# Patient Record
Sex: Male | Born: 1980 | Race: Black or African American | Hispanic: No | Marital: Married | Smoking: Former smoker
Health system: Southern US, Community
[De-identification: ages and names within clinical notes are randomized; demographics above are authoritative.]

## PROBLEM LIST (undated history)

## (undated) DIAGNOSIS — F419 Anxiety disorder, unspecified: Secondary | ICD-10-CM

## (undated) DIAGNOSIS — F4310 Post-traumatic stress disorder, unspecified: Secondary | ICD-10-CM

---

## 2011-09-29 ENCOUNTER — Encounter (HOSPITAL_COMMUNITY): Payer: Self-pay | Admitting: Emergency Medicine

## 2011-09-29 ENCOUNTER — Observation Stay (HOSPITAL_COMMUNITY): Payer: BC Managed Care – PPO

## 2011-09-29 ENCOUNTER — Emergency Department (HOSPITAL_COMMUNITY): Payer: BC Managed Care – PPO

## 2011-09-29 ENCOUNTER — Observation Stay (HOSPITAL_COMMUNITY)
Admission: EM | Admit: 2011-09-29 | Discharge: 2011-09-29 | Disposition: A | Discharge status: Home / Self Care | Payer: BC Managed Care – PPO | Attending: Emergency Medicine | Admitting: Emergency Medicine

## 2011-09-29 DIAGNOSIS — Z79899 Other long term (current) drug therapy: Secondary | ICD-10-CM | POA: Insufficient documentation

## 2011-09-29 DIAGNOSIS — F411 Generalized anxiety disorder: Secondary | ICD-10-CM | POA: Insufficient documentation

## 2011-09-29 DIAGNOSIS — F4310 Post-traumatic stress disorder, unspecified: Secondary | ICD-10-CM | POA: Insufficient documentation

## 2011-09-29 DIAGNOSIS — R079 Chest pain, unspecified: Principal | ICD-10-CM | POA: Insufficient documentation

## 2011-09-29 HISTORY — DX: Post-traumatic stress disorder, unspecified: F43.10

## 2011-09-29 HISTORY — DX: Anxiety disorder, unspecified: F41.9

## 2011-09-29 LAB — CBC
MCH: 30 pg (ref 26.0–34.0)
MCHC: 34.1 g/dL (ref 30.0–36.0)
MCV: 88 fL (ref 78.0–100.0)
Platelets: 247 10*3/uL (ref 150–400)
RDW: 12.2 % (ref 11.5–15.5)

## 2011-09-29 LAB — BASIC METABOLIC PANEL
Calcium: 9.7 mg/dL (ref 8.4–10.5)
Creatinine, Ser: 1.08 mg/dL (ref 0.50–1.35)
GFR calc Af Amer: >90 mL/min (ref >90)
GFR calc non Af Amer: >90 mL/min (ref >90)
Sodium: 137 mEq/L (ref 135–145)

## 2011-09-29 LAB — TROPONIN I: Troponin I: <0.3 ng/mL (ref <0.30)

## 2011-09-29 MED ORDER — METOPROLOL TARTRATE 1 MG/ML IV SOLN
INTRAVENOUS | Status: AC
  Filled 2011-09-29: qty 10

## 2011-09-29 MED ORDER — MORPHINE SULFATE 4 MG/ML IJ SOLN: 4.0000 mg | Freq: Once | INTRAMUSCULAR | Status: DC

## 2011-09-29 MED ORDER — METOPROLOL TARTRATE 25 MG PO TABS
100.0000 mg | ORAL_TABLET | Freq: Once | ORAL | Status: AC
  Administered 2011-09-29: 100 mg via ORAL
  Filled 2011-09-29: qty 4

## 2011-09-29 MED ORDER — NITROGLYCERIN 0.4 MG SL SUBL
0.4000 mg | SUBLINGUAL_TABLET | SUBLINGUAL | Status: DC | PRN
  Filled 2011-09-29: qty 25

## 2011-09-29 MED ORDER — IOHEXOL 350 MG/ML SOLN
80.0000 mL | Freq: Once | INTRAVENOUS | Status: AC | PRN
  Administered 2011-09-29: 80 mL via INTRAVENOUS

## 2011-09-29 MED ORDER — ONDANSETRON HCL 4 MG/2ML IJ SOLN: 4.0000 mg | Freq: Once | INTRAMUSCULAR | Status: DC

## 2011-09-29 NOTE — ED Notes (Signed)
Pt with substernal chest pain, non-radiating, denies nausea, vomiting, SOB or diaphoresis. Hx of PTSD and anxiety. Pt initially rating pain 5/10. Total relief for EMS with 1 SL nitro. 20g RAC. 324mg  ASA PTA.

## 2011-09-29 NOTE — ED Notes (Signed)
Patient education provided on coronary ct

## 2011-09-29 NOTE — ED Notes (Signed)
His bmi is 35.7, ct called and made aware, they will let dr Kearney Hard know

## 2011-09-29 NOTE — ED Provider Notes (Signed)
ECG shows normal sinus rhythm with a rate of 80, no ectopy. Normal axis. Normal P wave. Normal QRS. Normal intervals. Normal ST and T waves. Impression: normal ECG. No old ECG available for comparison.   Dione Booze, MD 09/29/11 1016

## 2011-09-29 NOTE — ED Notes (Signed)
Return from CT.   While in CT  16:40 heart rate 70  Pt was given Metoprolol 5mg  IV.  16:45 heart rate 67 Pt was given Metoprolol 5mg . IV   16:48 heart rate 68 Pt was given NTG 1 SL.  Pt tolerated procedure well.

## 2011-09-29 NOTE — ED Notes (Signed)
Pt continues to be pain free. Intermittently sleeping. Pt aware of plan to recheck labs and d/c home if normal.

## 2011-09-29 NOTE — Discharge Instructions (Signed)
Read the information below.  The CT scan of your heart shows that you currently have no coronary artery disease.  Please follow up with your primary care provider.  You may return to the ER at any time for worsening condition or any new symptoms that concern you.

## 2011-09-29 NOTE — ED Provider Notes (Signed)
Medical screening examination/treatment/procedure(s) were performed by non-physician practitioner and as supervising physician I was immediately available for consultation/collaboration.   Dione Booze, MD 09/29/11 [phone]

## 2011-09-29 NOTE — ED Provider Notes (Signed)
3:49 PM Patient is in CDU under observation, chest pain protocol.  Pt's only medical hx is PTSD and anxiety, but has strong family hx of CAD.  No PCP - pt is in the [place] system but has not become established here.  Pt reports he continues to have intermittent chest pain at rest, occuring every 30-40 minutes, lasting 10 minutes at a time.  Pain is sharp and over the left chest.  No associated symptoms.  Plan is for coronary CT.  BMI is slightly higher than recommended, nurse has alerted the radiologist for permission to proceed.  On exam, pt is A&Ox4, NAD, RRR, no m/r/g, CTAB, abd soft, NT, extremities without edema, distal pulses intact and equal bilaterally.  No needs at this time.  Will continue to follow.    5:11 PM I received a call from Dr Kearney Hard.  Coronary CT is negative for CAD, no additional findings.    5:41 PM Discussed results with patient.  Pt states he is actually established already with the [place] in [place], will follow up with them.  Plan is for d/c home , PCP follow up.  Patient verbalizes understanding and agrees with plan.    Brandon [name] [place], [place] 09/29/11 [phone]

## 2011-09-29 NOTE — ED Provider Notes (Signed)
History     CSN: [id number]  Arrival date & time 09/29/11  1006   First MD Initiated Contact with Patient 09/29/11 1009      Chief Complaint  Patient presents with  . Chest Pain    (Consider location/radiation/quality/duration/timing/severity/associated sxs/prior treatment) HPI  Patient to ED from work by EMS for chest pains. They started acutely at 9am and were sharp, constant and substernal. The pains were 6/10 and relieved with 324 asa and 1 SL nitro given by EMS prior to arrival. Prior to arrival patients pain completely alleviated. He says this happened once before about 10 years ago and he was told it was stress and anxiety related. He has a history of PTSD and takes a "medication that starts with an 'M'" for it.  During my interview he informs me that his chest pain is coming back and is a 5/10 now. Denies recent illness, cough, SOB, and feeling anxious  Patient informs me he has a significant family history a cousin died at 4 recently, an uncle and a few other cousins have heart disease. He says that he does not know about his mom, dad, brothers or sisters. They are all still alive and they have never had chest pains before, according to patient.  Past Medical History  Diagnosis Date  . Anxiety   . PTSD (post-traumatic stress disorder)     History reviewed. No pertinent past surgical history.  History reviewed. No pertinent family history.  History  Substance Use Topics  . Smoking status: Former Games developer  . Smokeless tobacco: Not on file  . Alcohol Use: Yes     socially weekends      Review of Systems   HEENT: denies blurry vision or change in hearing PULMONARY: Denies difficulty breathing and SOB CARDIAC: denies heart palpitations MUSCULOSKELETAL:  denies being unable to ambulate ABDOMEN AL: denies abdominal pain GU: denies loss of bowel or urinary control NEURO: denies numbness and tingling in extremities SKIN: no new rashes PSYCH: patient behavior is  normal NECK: Not complaining of neck pain     Allergies  Review of patient's allergies indicates not on file.  Home Medications   Current Outpatient Rx  Name Route Sig Dispense Refill  . NON FORMULARY Oral Take 1 tablet by mouth daily.      BP 115/64  Pulse 70  Temp(Src) 98 F (36.7 C) (Oral)  Resp 18  SpO2 97%  Physical Exam  Nursing note and vitals reviewed. Constitutional: He appears well-developed and well-nourished. No distress.  HENT:  Head: Normocephalic and atraumatic.  Eyes: Pupils are equal, round, and reactive to light.  Neck: Normal range of motion. Neck supple.  Cardiovascular: Normal rate and regular rhythm.   Pulmonary/Chest: Effort normal.  Abdominal: Soft.  Neurological: He is alert.  Skin: Skin is warm and dry.    ED Course  Procedures (including critical care time)  Labs Reviewed  CBC - Abnormal; Notable for the following:    RBC 4.10 (*)    Hemoglobin 12.3 (*)    HCT 36.1 (*)    All other components within normal limits  BASIC METABOLIC PANEL  TROPONIN I  TROPONIN I   Dg Chest 2 View  09/29/2011  *RADIOLOGY REPORT*  Clinical Data: Left-sided chest pain.  CHEST - 2 VIEW  Comparison: None.  Findings:  Cardiopericardial silhouette within normal limits. Mediastinal contours normal. Trachea midline.  No airspace disease or effusion.  IMPRESSION: Negative two-view chest.  Original Report Authenticated By: Andreas Newport, M.D.  1. Chest pain       MDM   Date: 09/29/2011  Rate: 80  Rhythm: normal sinus rhythm  QRS Axis: normal  Intervals: normal  ST/T Wave abnormalities: normal  Conduction Disutrbances:none  Narrative Interpretation:   Old EKG Reviewed: none available and unchanged   Patient is currently CP free, no cardiac history, negative EKG, negative Troponin.  I have discussed with Dr. Preston Fleeting and we feel that CPP with CT angio of heart is recommended for a rule out.  I have discussed patient with Melvenia Beam, PA-C. Who has  agreed to accept patient into the CDU.       Dorthula Matas, PA 09/29/11 1353

## 2011-09-30 NOTE — ED Provider Notes (Signed)
Medical screening examination/treatment/procedure(s) were performed by non-physician practitioner and as supervising physician I was immediately available for consultation/collaboration.   Dione Booze, MD 09/30/11 [phone]

## 2012-05-12 ENCOUNTER — Ambulatory Visit: Payer: BC Managed Care – PPO

## 2012-05-12 ENCOUNTER — Ambulatory Visit (INDEPENDENT_AMBULATORY_CARE_PROVIDER_SITE_OTHER): Payer: BC Managed Care – PPO | Admitting: Family Medicine

## 2012-05-12 VITALS — BP 135/81 | HR 96 | Temp 98.8°F | Resp 18 | Ht 68.5 in | Wt 242.0 lb

## 2012-05-12 DIAGNOSIS — R042 Hemoptysis: Secondary | ICD-10-CM

## 2012-05-12 DIAGNOSIS — K219 Gastro-esophageal reflux disease without esophagitis: Secondary | ICD-10-CM

## 2012-05-12 MED ORDER — HYDROCODONE-HOMATROPINE 5-1.5 MG/5ML PO SYRP: 5.0000 mL | ORAL_SOLUTION | Freq: Three times a day (TID) | ORAL | Status: DC | PRN

## 2012-05-12 MED ORDER — OMEPRAZOLE 20 MG PO CPDR: 20.0000 mg | DELAYED_RELEASE_CAPSULE | Freq: Every day | ORAL | Status: AC

## 2012-05-12 MED ORDER — AZITHROMYCIN 250 MG PO TABS: ORAL_TABLET | ORAL | Status: DC

## 2012-05-12 NOTE — Progress Notes (Signed)
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  Also has one week of reflux symptoms with heartburn and vomited last night.  Nonsmoker, no h/o asthma  Rx:  Alka-seltzer, advil cold and sinus, robitussin  Objective:  NAD, alert, cooperative Ears:  Some wax right ear Oroph:  Clear, mild erythema Heart:  Regular, no murmur Chest:  ronchi bilaterally  [facility] reading (PRIMARY) by  Dr. Milus Glazier:  CXR heavy markings  1. Hemoptysis  DG Chest 2 View, HYDROcodone-homatropine (HYCODAN) 5-1.5 MG/5ML syrup, azithromycin (ZITHROMAX Z-PAK) 250 MG tablet  2. GERD (gastroesophageal reflux disease)  omeprazole (PRILOSEC) 20 MG capsule   .

## 2013-05-18 ENCOUNTER — Ambulatory Visit: Payer: BC Managed Care – PPO

## 2013-05-18 ENCOUNTER — Ambulatory Visit (INDEPENDENT_AMBULATORY_CARE_PROVIDER_SITE_OTHER): Payer: BC Managed Care – PPO | Admitting: Emergency Medicine

## 2013-05-18 VITALS — BP 112/70 | HR 69 | Temp 98.0°F | Resp 16 | Ht 68.0 in | Wt 220.0 lb

## 2013-05-18 DIAGNOSIS — M25579 Pain in unspecified ankle and joints of unspecified foot: Secondary | ICD-10-CM

## 2013-05-18 DIAGNOSIS — S93409A Sprain of unspecified ligament of unspecified ankle, initial encounter: Secondary | ICD-10-CM

## 2013-05-18 MED ORDER — ACETAMINOPHEN-CODEINE #3 300-30 MG PO TABS: 1.0000 | ORAL_TABLET | ORAL | Status: DC | PRN

## 2013-05-18 MED ORDER — NAPROXEN SODIUM 550 MG PO TABS: 550.0000 mg | ORAL_TABLET | Freq: Two times a day (BID) | ORAL | Status: AC

## 2013-05-18 NOTE — Progress Notes (Addendum)
[facility] [address], [place] [address] [phone]  Date:  05/18/2013   Name:  Brandon [name]   DOB:  Jun 04, 1980   MRN:  [id number]  PCP:  No primary provider on file.    Chief Complaint: Foot Pain   History of Present Illness:  Brandon CarinaDeon Fieldhouse is a 33 y.o. very pleasant male patient who presents with the following:  Injured today when he stepped on another player's foot while playing basketball.  Suffered an inversion injury.  Not able to comfortably bear weight.  No improvement with over the counter medications or other home remedies. Denies other complaint or health concern today.  There are no active problems to display for this patient.   Past Medical History  Diagnosis Date  . Anxiety   . PTSD (post-traumatic stress disorder)     History reviewed. No pertinent past surgical history.  History  Substance Use Topics  . Smoking status: Former Games developermoker  . Smokeless tobacco: Not on file  . Alcohol Use: No     Comment: socially weekends    Family History  Problem Relation Age of Onset  . Diabetes Mother   . Hypertension Mother   . Heart disease Maternal Aunt   . Heart disease Maternal Uncle   . Heart disease Cousin     No Known Allergies  Medication list has been reviewed and updated.  Current Outpatient Prescriptions on File Prior to Visit  Medication Sig Dispense Refill  . azithromycin (ZITHROMAX Z-PAK) 250 MG tablet Take as directed on pack  6 tablet  0  . HYDROcodone-homatropine (HYCODAN) 5-1.5 MG/5ML syrup Take 5 mLs by mouth every 8 (eight) hours as needed for cough.  120 mL  0  . NON FORMULARY Take 1 tablet by mouth daily.      Marland Kitchen. omeprazole (PRILOSEC) 20 MG capsule Take 1 capsule (20 mg total) by mouth daily.  20 capsule  3   No current facility-administered medications on file prior to visit.    Review of Systems:  As per HPI, otherwise negative.    Physical Examination: Filed Vitals:   05/18/13 1601  BP: 112/70   Pulse: 69  Temp: 98 F (36.7 C)  Resp: 16   Filed Vitals:   05/18/13 1601  Height: 5\' 8"  (1.727 m)  Weight: 220 lb (99.791 kg)   Body mass index is 33.46 kg/(m^2). Ideal Body Weight: Weight in (lb) to have BMI = 25: 164.1   GEN: WDWN, NAD, Non-toxic, Alert & Oriented x 3 HEENT: Atraumatic, Normocephalic.  Ears and Nose: No external deformity. EXTR: No clubbing/cyanosis/edema NEURO: Normal gait.  PSYCH: Normally interactive. Conversant. Not depressed or anxious appearing.  Calm demeanor.  LEFT ankle:  Tender lateral foot in fifth MT region  Assessment and Plan: Sprained left ankle Boot Anaprox Tylenol #3    Signed,  Phillips OdorJeffery Efe Fazzino, MD  [facility] reading (PRIMARY) by  Dr. Dareen PianoAnderson.  Negative ankle. [facility] reading (PRIMARY) by  Dr. Dareen PianoAnderson. Negative foot.

## 2013-05-18 NOTE — Patient Instructions (Signed)

## 2013-05-26 ENCOUNTER — Ambulatory Visit: Payer: BC Managed Care – PPO

## 2013-05-26 ENCOUNTER — Ambulatory Visit (INDEPENDENT_AMBULATORY_CARE_PROVIDER_SITE_OTHER): Payer: BC Managed Care – PPO | Admitting: Family Medicine

## 2013-05-26 VITALS — BP 110/72 | HR 68 | Temp 98.4°F | Resp 16

## 2013-05-26 DIAGNOSIS — S93609A Unspecified sprain of unspecified foot, initial encounter: Secondary | ICD-10-CM

## 2013-05-26 DIAGNOSIS — S93602A Unspecified sprain of left foot, initial encounter: Secondary | ICD-10-CM
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
## 2013-05-26 NOTE — Patient Instructions (Signed)
1. Wear supportive shoe at all times; avoid walking barefooted. 2. Return if pain persists or worsens. 3.  You can return to regular duty at work.

## 2013-06-12 ENCOUNTER — Ambulatory Visit (INDEPENDENT_AMBULATORY_CARE_PROVIDER_SITE_OTHER): Payer: BC Managed Care – PPO | Admitting: Podiatry

## 2013-06-12 ENCOUNTER — Encounter: Payer: Self-pay | Admitting: Podiatry

## 2013-06-12 ENCOUNTER — Ambulatory Visit (INDEPENDENT_AMBULATORY_CARE_PROVIDER_SITE_OTHER): Payer: BC Managed Care – PPO

## 2013-06-12 VITALS — BP 117/74 | HR 85 | Resp 16 | Ht 68.0 in | Wt 210.0 lb

## 2013-06-12 DIAGNOSIS — M79609 Pain in unspecified limb: Secondary | ICD-10-CM

## 2013-06-12 DIAGNOSIS — M79673 Pain in unspecified foot: Secondary | ICD-10-CM

## 2013-06-12 DIAGNOSIS — S93409A Sprain of unspecified ligament of unspecified ankle, initial encounter: Secondary | ICD-10-CM

## 2013-06-12 DIAGNOSIS — M7750 Other enthesopathy of unspecified foot: Secondary | ICD-10-CM

## 2013-06-12 MED ORDER — TRIAMCINOLONE ACETONIDE 10 MG/ML IJ SUSP
10.0000 mg | Freq: Once | INTRAMUSCULAR | Status: AC
  Administered 2013-06-12: 10 mg

## 2013-06-12 NOTE — Progress Notes (Signed)
   Subjective:    Patient ID: Brandon [name], male    DOB: 03/27/1981, 33 y.o.   MRN: [id number]  HPI Comments: "I hurt my foot playing basketball"  Patient C/O painful lateral left foot for 3 weeks now. States that he injured his foot while playing basketball. Says that he stepped on someone's foot and his foot rolled over. Since it's been swollen and now has a knot. He has been using ice and elevating. He did present to Urgent Care (notes are in chart review) early Feb. Says that they xrayed, said no fracture, put in boot, wore for 1 week and Rx 'd him pain medication. It does feel some better.  Foot Pain      Review of Systems  Musculoskeletal: Positive for gait problem.       Objective:   Physical Exam        Assessment & Plan:

## 2013-06-12 NOTE — Progress Notes (Signed)
Subjective:     Patient ID: Brandon [name], male   DOB: Aug 25, 1980, 33 y.o.   MRN: [id number]  Foot Pain   patient presents stating I turned my left ankle and it has still been bothering me when I try to be active. Stated it occurred 3 weeks ago and he would like to see his or anything that can be done to help the condition   Review of Systems  All other systems reviewed and are negative.       Objective:   Physical Exam  Constitutional: He is oriented to person, place, and time.  Cardiovascular: Intact distal pulses.   Musculoskeletal: Normal range of motion.  Neurological: He is oriented to person, place, and time.  Skin: Skin is warm.   neurovascular status intact with muscle strength adequate and no indications of range of motion loss except some splinting in the left ankle due to pain. The pain is around the fifth metatarsal base with mild inflammation and bruising noted with no indications of tendon compromise     Assessment:     Probable tendinitis with possible fracture left fifth metatarsal base secondary to injury 3 weeks ago    Plan:     H&P and x-rays reviewed. Today careful injection was administered to this area with instructions on reduced activity ice therapy and stretching exercises. Hopefully this will reduce the symptoms for him but it's possible that this still may take another 8-12 weeks to heal. Reappoint if symptoms persist

## 2014-12-22 ENCOUNTER — Emergency Department (HOSPITAL_COMMUNITY): Payer: BLUE CROSS/BLUE SHIELD | Admitting: Certified Registered Nurse Anesthetist

## 2014-12-22 ENCOUNTER — Encounter (HOSPITAL_COMMUNITY)
Admission: EM | Disposition: A | Discharge status: Home Health | Payer: Self-pay | Source: Home / Self Care | Attending: Orthopedic Surgery

## 2014-12-22 ENCOUNTER — Emergency Department (HOSPITAL_COMMUNITY): Payer: BLUE CROSS/BLUE SHIELD

## 2014-12-22 ENCOUNTER — Inpatient Hospital Stay (HOSPITAL_COMMUNITY)
Admission: EM | Admit: 2014-12-22 | Discharge: 2014-12-24 | DRG: 494 | Disposition: A | Discharge status: Home Health | Payer: BLUE CROSS/BLUE SHIELD | Attending: Orthopedic Surgery | Admitting: Orthopedic Surgery

## 2014-12-22 ENCOUNTER — Encounter (HOSPITAL_COMMUNITY): Payer: Self-pay | Admitting: Emergency Medicine

## 2014-12-22 DIAGNOSIS — M79661 Pain in right lower leg: Secondary | ICD-10-CM | POA: Diagnosis not present

## 2014-12-22 DIAGNOSIS — T1490XA Injury, unspecified, initial encounter: Secondary | ICD-10-CM

## 2014-12-22 DIAGNOSIS — S82201C Unspecified fracture of shaft of right tibia, initial encounter for open fracture type IIIA, IIIB, or IIIC: Secondary | ICD-10-CM

## 2014-12-22 DIAGNOSIS — S82201B Unspecified fracture of shaft of right tibia, initial encounter for open fracture type I or II: Principal | ICD-10-CM | POA: Diagnosis present

## 2014-12-22 DIAGNOSIS — S82209A Unspecified fracture of shaft of unspecified tibia, initial encounter for closed fracture: Secondary | ICD-10-CM

## 2014-12-22 DIAGNOSIS — Z87891 Personal history of nicotine dependence: Secondary | ICD-10-CM

## 2014-12-22 DIAGNOSIS — S82209B Unspecified fracture of shaft of unspecified tibia, initial encounter for open fracture type I or II: Secondary | ICD-10-CM | POA: Diagnosis present

## 2014-12-22 DIAGNOSIS — S82201A Unspecified fracture of shaft of right tibia, initial encounter for closed fracture: Secondary | ICD-10-CM | POA: Diagnosis present

## 2014-12-22 DIAGNOSIS — S82401C Unspecified fracture of shaft of right fibula, initial encounter for open fracture type IIIA, IIIB, or IIIC: Secondary | ICD-10-CM

## 2014-12-22 DIAGNOSIS — Z79899 Other long term (current) drug therapy: Secondary | ICD-10-CM

## 2014-12-22 DIAGNOSIS — Z419 Encounter for procedure for purposes other than remedying health state, unspecified: Secondary | ICD-10-CM

## 2014-12-22 DIAGNOSIS — Y92524 Gas station as the place of occurrence of the external cause: Secondary | ICD-10-CM

## 2014-12-22 HISTORY — PX: TIBIA IM NAIL INSERTION: SHX2516

## 2014-12-22 HISTORY — PX: I & D EXTREMITY: SHX5045

## 2014-12-22 HISTORY — PX: ORIF FIBULA FRACTURE: SHX5114

## 2014-12-22 LAB — ETHANOL

## 2014-12-22 LAB — PROTIME-INR
INR: 1.04 (ref 0.00–1.49)
PROTHROMBIN TIME: 13.8 s (ref 11.6–15.2)

## 2014-12-22 LAB — COMPREHENSIVE METABOLIC PANEL
ALBUMIN: 4.3 g/dL (ref 3.5–5.0)
ALK PHOS: 57 U/L (ref 38–126)
ALT: 14 U/L — ABNORMAL LOW (ref 17–63)
ANION GAP: 8 (ref 5–15)
AST: 30 U/L (ref 15–41)
BILIRUBIN TOTAL: 0.8 mg/dL (ref 0.3–1.2)
BUN: 9 mg/dL (ref 6–20)
CALCIUM: 9 mg/dL (ref 8.9–10.3)
CO2: 27 mmol/L (ref 22–32)
Chloride: 101 mmol/L (ref 101–111)
Creatinine, Ser: 1.23 mg/dL (ref 0.61–1.24)
GFR calc non Af Amer: >60 mL/min (ref >60)
GLUCOSE: 106 mg/dL — AB (ref 65–99)
Potassium: 3.9 mmol/L (ref 3.5–5.1)
Sodium: 136 mmol/L (ref 135–145)
TOTAL PROTEIN: 7.7 g/dL (ref 6.5–8.1)

## 2014-12-22 LAB — CBC
HCT: 36.3 % — ABNORMAL LOW (ref 39.0–52.0)
HEMOGLOBIN: 12.2 g/dL — AB (ref 13.0–17.0)
MCH: 29.9 pg (ref 26.0–34.0)
MCHC: 33.6 g/dL (ref 30.0–36.0)
MCV: 89 fL (ref 78.0–100.0)
PLATELETS: 232 10*3/uL (ref 150–400)
RBC: 4.08 MIL/uL — ABNORMAL LOW (ref 4.22–5.81)
RDW: 12 % (ref 11.5–15.5)
WBC: 9.1 10*3/uL (ref 4.0–10.5)

## 2014-12-22 LAB — SAMPLE TO BLOOD BANK

## 2014-12-22 SURGERY — INSERTION, INTRAMEDULLARY ROD, TIBIA
Anesthesia: General | Site: Leg Lower | Laterality: Right

## 2014-12-22 MED ORDER — MIDAZOLAM HCL 5 MG/5ML IJ SOLN
INTRAMUSCULAR | Status: DC | PRN
  Administered 2014-12-22: 2 mg via INTRAVENOUS

## 2014-12-22 MED ORDER — LIDOCAINE HCL (CARDIAC) 20 MG/ML IV SOLN
INTRAVENOUS | Status: DC | PRN
  Administered 2014-12-22: 50 mg via INTRAVENOUS

## 2014-12-22 MED ORDER — SUCCINYLCHOLINE CHLORIDE 20 MG/ML IJ SOLN
INTRAMUSCULAR | Status: DC | PRN
  Administered 2014-12-22: 120 mg via INTRAVENOUS

## 2014-12-22 MED ORDER — TETANUS-DIPHTHERIA TOXOIDS TD 5-2 LFU IM INJ
0.5000 mL | INJECTION | Freq: Once | INTRAMUSCULAR | Status: AC
  Administered 2014-12-22: 0.5 mL via INTRAMUSCULAR
  Filled 2014-12-22: qty 0.5

## 2014-12-22 MED ORDER — CEFAZOLIN SODIUM-DEXTROSE 2-3 GM-% IV SOLR
INTRAVENOUS | Status: DC | PRN
  Administered 2014-12-22: 2 g via INTRAVENOUS

## 2014-12-22 MED ORDER — ROCURONIUM BROMIDE 100 MG/10ML IV SOLN
INTRAVENOUS | Status: DC | PRN
  Administered 2014-12-22: 10 mg via INTRAVENOUS
  Administered 2014-12-22: 50 mg via INTRAVENOUS
  Administered 2014-12-22: 20 mg via INTRAVENOUS
  Administered 2014-12-22: 10 mg via INTRAVENOUS

## 2014-12-22 MED ORDER — MIDAZOLAM HCL 2 MG/2ML IJ SOLN
INTRAMUSCULAR | Status: AC
  Filled 2014-12-22: qty 4

## 2014-12-22 MED ORDER — PROPOFOL 10 MG/ML IV BOLUS
INTRAVENOUS | Status: AC
  Filled 2014-12-22: qty 20

## 2014-12-22 MED ORDER — DEXAMETHASONE SODIUM PHOSPHATE 4 MG/ML IJ SOLN
INTRAMUSCULAR | Status: AC
  Filled 2014-12-22: qty 2

## 2014-12-22 MED ORDER — DEXAMETHASONE SODIUM PHOSPHATE 4 MG/ML IJ SOLN
INTRAMUSCULAR | Status: DC | PRN
  Administered 2014-12-22: 8 mg via INTRAVENOUS

## 2014-12-22 MED ORDER — HYDROMORPHONE HCL 1 MG/ML IJ SOLN
1.0000 mg | Freq: Once | INTRAMUSCULAR | Status: AC
  Administered 2014-12-22: 1 mg via INTRAVENOUS
  Filled 2014-12-22: qty 1

## 2014-12-22 MED ORDER — SODIUM CHLORIDE 0.9 % IV BOLUS (SEPSIS)
500.0000 mL | Freq: Once | INTRAVENOUS | Status: AC
  Administered 2014-12-22: 500 mL via INTRAVENOUS

## 2014-12-22 MED ORDER — ONDANSETRON HCL 4 MG/2ML IJ SOLN
INTRAMUSCULAR | Status: AC
  Filled 2014-12-22: qty 6

## 2014-12-22 MED ORDER — 0.9 % SODIUM CHLORIDE (POUR BTL) OPTIME
TOPICAL | Status: DC | PRN
  Administered 2014-12-22: 1000 mL

## 2014-12-22 MED ORDER — SODIUM CHLORIDE 0.9 % IR SOLN
Status: DC | PRN
  Administered 2014-12-22 (×2): 3000 mL

## 2014-12-22 MED ORDER — NEOSTIGMINE METHYLSULFATE 10 MG/10ML IV SOLN
INTRAVENOUS | Status: AC
  Filled 2014-12-22: qty 2

## 2014-12-22 MED ORDER — PROPOFOL 10 MG/ML IV BOLUS
INTRAVENOUS | Status: DC | PRN
  Administered 2014-12-22: 300 mg via INTRAVENOUS

## 2014-12-22 MED ORDER — ONDANSETRON HCL 4 MG/2ML IJ SOLN
4.0000 mg | Freq: Once | INTRAMUSCULAR | Status: AC
  Administered 2014-12-22: 4 mg via INTRAVENOUS
  Filled 2014-12-22: qty 2

## 2014-12-22 MED ORDER — SODIUM CHLORIDE 0.9 % IV BOLUS (SEPSIS)
1000.0000 mL | Freq: Once | INTRAVENOUS | Status: AC
  Administered 2014-12-22: 1000 mL via INTRAVENOUS

## 2014-12-22 MED ORDER — LACTATED RINGERS IV SOLN
INTRAVENOUS | Status: DC | PRN
  Administered 2014-12-22 – 2014-12-23 (×3): via INTRAVENOUS

## 2014-12-22 MED ORDER — ARTIFICIAL TEARS OP OINT
TOPICAL_OINTMENT | OPHTHALMIC | Status: AC
  Filled 2014-12-22: qty 3.5

## 2014-12-22 MED ORDER — CEFAZOLIN SODIUM 1-5 GM-% IV SOLN
1.0000 g | Freq: Once | INTRAVENOUS | Status: AC
  Administered 2014-12-22: 1 g via INTRAVENOUS
  Filled 2014-12-22: qty 50

## 2014-12-22 MED ORDER — ARTIFICIAL TEARS OP OINT
TOPICAL_OINTMENT | OPHTHALMIC | Status: DC | PRN
  Administered 2014-12-22: 1 via OPHTHALMIC

## 2014-12-22 MED ORDER — FENTANYL CITRATE (PF) 250 MCG/5ML IJ SOLN
INTRAMUSCULAR | Status: AC
Start: 2014-12-22 — End: 2014-12-22
  Filled 2014-12-22: qty 5

## 2014-12-22 MED ORDER — FENTANYL CITRATE (PF) 250 MCG/5ML IJ SOLN
INTRAMUSCULAR | Status: AC
  Filled 2014-12-22: qty 5

## 2014-12-22 MED ORDER — FENTANYL CITRATE (PF) 100 MCG/2ML IJ SOLN
INTRAMUSCULAR | Status: DC | PRN
Start: 2014-12-22 — End: 2014-12-23
  Administered 2014-12-22: 100 ug via INTRAVENOUS
  Administered 2014-12-22: 50 ug via INTRAVENOUS
  Administered 2014-12-22 (×2): 100 ug via INTRAVENOUS
  Administered 2014-12-22 – 2014-12-23 (×3): 50 ug via INTRAVENOUS

## 2014-12-22 SURGICAL SUPPLY — 85 items
BANDAGE ELASTIC 6 VELCRO ST LF (GAUZE/BANDAGES/DRESSINGS) ×3 IMPLANT
BIT DRILL AO GAMMA 4.2X340 (BIT) ×6 IMPLANT
BLADE SURG 10 STRL SS (BLADE) IMPLANT
BNDG COHESIVE 4X5 TAN STRL (GAUZE/BANDAGES/DRESSINGS) ×3 IMPLANT
BNDG GAUZE ELAST 4 BULKY (GAUZE/BANDAGES/DRESSINGS) ×3 IMPLANT
BOOTCOVER CLEANROOM LRG (PROTECTIVE WEAR) IMPLANT
CHLORAPREP W/TINT 26ML (MISCELLANEOUS) ×3 IMPLANT
COVER MAYO STAND STRL (DRAPES) ×3 IMPLANT
COVER SURGICAL LIGHT HANDLE (MISCELLANEOUS) ×3 IMPLANT
CUFF TOURNIQUET SINGLE 34IN LL (TOURNIQUET CUFF) IMPLANT
CUFF TOURNIQUET SINGLE 44IN (TOURNIQUET CUFF) IMPLANT
DRAPE C-ARM 42X72 X-RAY (DRAPES) ×3 IMPLANT
DRAPE C-ARMOR (DRAPES) ×3 IMPLANT
DRAPE IMP U-DRAPE 54X76 (DRAPES) IMPLANT
DRAPE PROXIMA HALF (DRAPES) ×3 IMPLANT
DRESSING OPSITE X SMALL 2X3 (GAUZE/BANDAGES/DRESSINGS) IMPLANT
DRILL 2.6X122MM WL AO SHAFT (BIT) ×3 IMPLANT
DRSG ADAPTIC 3X8 NADH LF (GAUZE/BANDAGES/DRESSINGS) ×3 IMPLANT
DRSG PAD ABDOMINAL 8X10 ST (GAUZE/BANDAGES/DRESSINGS) ×3 IMPLANT
DRSG TEGADERM 4X4.75 (GAUZE/BANDAGES/DRESSINGS) IMPLANT
DURAPREP 26ML APPLICATOR (WOUND CARE) IMPLANT
ELECT CAUTERY BLADE 6.4 (BLADE) ×3 IMPLANT
ELECT REM PT RETURN 9FT ADLT (ELECTROSURGICAL) ×3
ELECTRODE REM PT RTRN 9FT ADLT (ELECTROSURGICAL) ×1 IMPLANT
EVACUATOR 1/8 PVC DRAIN (DRAIN) IMPLANT
FACESHIELD WRAPAROUND (MASK) IMPLANT
GAUZE SPONGE 4X4 12PLY STRL (GAUZE/BANDAGES/DRESSINGS) IMPLANT
GAUZE XEROFORM 1X8 LF (GAUZE/BANDAGES/DRESSINGS) IMPLANT
GLOVE BIO SURGEON STRL SZ7 (GLOVE) ×3 IMPLANT
GLOVE BIO SURGEON STRL SZ7.5 (GLOVE) ×9 IMPLANT
GLOVE BIO SURGEON STRL SZ8 (GLOVE) ×3 IMPLANT
GLOVE BIO SURGEON STRL SZ8.5 (GLOVE) ×3 IMPLANT
GLOVE BIOGEL PI IND STRL 7.0 (GLOVE) ×2 IMPLANT
GLOVE BIOGEL PI IND STRL 8 (GLOVE) ×1 IMPLANT
GLOVE BIOGEL PI INDICATOR 7.0 (GLOVE) ×4
GLOVE BIOGEL PI INDICATOR 8 (GLOVE) ×2
GLOVE ECLIPSE 7.0 STRL STRAW (GLOVE) ×3 IMPLANT
GLOVE ORTHO TXT STRL SZ7.5 (GLOVE) IMPLANT
GOWN STRL REUS W/ TWL LRG LVL3 (GOWN DISPOSABLE) ×1 IMPLANT
GOWN STRL REUS W/TWL 2XL LVL3 (GOWN DISPOSABLE) ×3 IMPLANT
GOWN STRL REUS W/TWL LRG LVL3 (GOWN DISPOSABLE) ×2
GUIDEROD T2 3X1000 (ROD) IMPLANT
GUIDEWIR  BALL TIP .25X800MM (WIRE) ×3 IMPLANT
GUIDEWIRE GAMMA (WIRE) ×3 IMPLANT
GUIDEWIRE GAMMA 800 (WIRE) ×3 IMPLANT
HANDPIECE INTERPULSE COAX TIP (DISPOSABLE) ×2
KIT BASIN OR (CUSTOM PROCEDURE TRAY) ×3 IMPLANT
KIT ROOM TURNOVER OR (KITS) ×3 IMPLANT
MANIFOLD NEPTUNE II (INSTRUMENTS) ×3 IMPLANT
NAIL TIBIA STD 9X330MM (Nail) ×3 IMPLANT
NS IRRIG 1000ML POUR BTL (IV SOLUTION) ×3 IMPLANT
PACK ORTHO EXTREMITY (CUSTOM PROCEDURE TRAY) ×3 IMPLANT
PACK UNIVERSAL I (CUSTOM PROCEDURE TRAY) IMPLANT
PAD ARMBOARD 7.5X6 YLW CONV (MISCELLANEOUS) ×3 IMPLANT
PAD CAST 4YDX4 CTTN HI CHSV (CAST SUPPLIES) IMPLANT
PADDING CAST COTTON 4X4 STRL (CAST SUPPLIES)
PENCIL BUTTON HOLSTER BLD 10FT (ELECTRODE) IMPLANT
PLATE 7H 96MM (Plate) ×3 IMPLANT
REAMER SHAFT BIXCUT (INSTRUMENTS) ×3 IMPLANT
SCREW BONE 14MMX3.5MM (Screw) ×3 IMPLANT
SCREW BONE 18 (Screw) ×3 IMPLANT
SCREW BONE 3.5X16MM (Screw) ×9 IMPLANT
SCREW LOCKING T2 F/T  5X42.5MM (Screw) ×2 IMPLANT
SCREW LOCKING T2 F/T 5X42.5MM (Screw) ×1 IMPLANT
SCREW LOCKING THREADED 5X47.5 (Screw) ×6 IMPLANT
SET HNDPC FAN SPRY TIP SCT (DISPOSABLE) ×1 IMPLANT
SPONGE GAUZE 4X4 12PLY STER LF (GAUZE/BANDAGES/DRESSINGS) ×3 IMPLANT
SPONGE LAP 18X18 X RAY DECT (DISPOSABLE) IMPLANT
STAPLER VISISTAT 35W (STAPLE) IMPLANT
STOCKINETTE IMPERVIOUS 9X36 MD (GAUZE/BANDAGES/DRESSINGS) ×3 IMPLANT
SUT ETHILON 3 0 PS 1 (SUTURE) ×18 IMPLANT
SUT MNCRL AB 4-0 PS2 18 (SUTURE) ×3 IMPLANT
SUT VIC AB 0 CT1 27 (SUTURE) ×2
SUT VIC AB 0 CT1 27XBRD ANBCTR (SUTURE) ×1 IMPLANT
SYR BULB IRRIGATION 50ML (SYRINGE) IMPLANT
TOWEL OR 17X24 6PK STRL BLUE (TOWEL DISPOSABLE) ×3 IMPLANT
TOWEL OR 17X26 10 PK STRL BLUE (TOWEL DISPOSABLE) ×3 IMPLANT
TOWEL OR NON WOVEN STRL DISP B (DISPOSABLE) IMPLANT
TUBE ANAEROBIC SPECIMEN COL (MISCELLANEOUS) IMPLANT
TUBE CONNECTING 12'X1/4 (SUCTIONS) ×1
TUBE CONNECTING 12X1/4 (SUCTIONS) ×2 IMPLANT
TUBING CYSTO DISP (UROLOGICAL SUPPLIES) ×3 IMPLANT
UNDERPAD 30X30 INCONTINENT (UNDERPADS AND DIAPERS) IMPLANT
WATER STERILE IRR 1000ML POUR (IV SOLUTION) IMPLANT
YANKAUER SUCT BULB TIP NO VENT (SUCTIONS) ×3 IMPLANT
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
## 2014-12-22 NOTE — Anesthesia Procedure Notes (Signed)
Procedure Name: Intubation Date/Time: 12/22/2014 9:58 PM Performed by: Pricilla Holm Pre-anesthesia Checklist: Patient identified, Emergency Drugs available, Suction available, Patient being monitored and Timeout performed Patient Re-evaluated:Patient Re-evaluated prior to inductionOxygen Delivery Method: Circle system utilized Preoxygenation: Pre-oxygenation with 100% oxygen Intubation Type: IV induction, Rapid sequence and Cricoid Pressure applied Grade View: Grade I Tube type: Oral Tube size: 8.0 mm Number of attempts: 1 Airway Equipment and Method: Stylet and Video-laryngoscopy Placement Confirmation: ETT inserted through vocal cords under direct vision,  positive ETCO2 and breath sounds checked- equal and bilateral Secured at: 23 cm Tube secured with: Tape Dental Injury: Teeth and Oropharynx as per pre-operative assessment

## 2014-12-22 NOTE — Progress Notes (Signed)
Orthopedic Tech Progress Note Patient Details:  Brandon [name] 05/11/1980 [id number]  Ortho Devices Type of Ortho Device: Ace wrap, Stirrup splint, Post (short leg) splint Ortho Device/Splint Location: RLE Ortho Device/Splint Interventions: Ordered, Application   Jennye Moccasin 12/22/2014, 8:39 PM
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
## 2014-12-23 ENCOUNTER — Emergency Department (HOSPITAL_COMMUNITY): Payer: BLUE CROSS/BLUE SHIELD

## 2014-12-23 ENCOUNTER — Encounter (HOSPITAL_COMMUNITY): Payer: Self-pay | Admitting: Orthopedic Surgery

## 2014-12-23 DIAGNOSIS — Y92524 Gas station as the place of occurrence of the external cause: Secondary | ICD-10-CM | POA: Diagnosis not present

## 2014-12-23 DIAGNOSIS — S82201B Unspecified fracture of shaft of right tibia, initial encounter for open fracture type I or II: Secondary | ICD-10-CM | POA: Diagnosis present

## 2014-12-23 DIAGNOSIS — S82209B Unspecified fracture of shaft of unspecified tibia, initial encounter for open fracture type I or II: Secondary | ICD-10-CM | POA: Diagnosis present

## 2014-12-23 DIAGNOSIS — Z87891 Personal history of nicotine dependence: Secondary | ICD-10-CM | POA: Diagnosis not present

## 2014-12-23 DIAGNOSIS — M79661 Pain in right lower leg: Secondary | ICD-10-CM | POA: Diagnosis present

## 2014-12-23 DIAGNOSIS — Z79899 Other long term (current) drug therapy: Secondary | ICD-10-CM | POA: Diagnosis not present

## 2014-12-23 DIAGNOSIS — S82201A Unspecified fracture of shaft of right tibia, initial encounter for closed fracture: Secondary | ICD-10-CM | POA: Diagnosis present

## 2014-12-23 MED ORDER — METOCLOPRAMIDE HCL 5 MG/ML IJ SOLN: 5.0000 mg | Freq: Three times a day (TID) | INTRAMUSCULAR | Status: DC | PRN

## 2014-12-23 MED ORDER — ENOXAPARIN SODIUM 40 MG/0.4ML ~~LOC~~ SOLN
40.0000 mg | SUBCUTANEOUS | Status: DC
  Administered 2014-12-24: 40 mg via SUBCUTANEOUS
  Filled 2014-12-23 (×2): qty 0.4

## 2014-12-23 MED ORDER — OXYCODONE HCL 5 MG PO TABS
5.0000 mg | ORAL_TABLET | ORAL | Status: DC | PRN
  Administered 2014-12-23 (×3): 10 mg via ORAL
  Administered 2014-12-23: 5 mg via ORAL
  Administered 2014-12-23 – 2014-12-24 (×7): 10 mg via ORAL
  Filled 2014-12-23 (×11): qty 2

## 2014-12-23 MED ORDER — ONDANSETRON HCL 4 MG/2ML IJ SOLN
4.0000 mg | Freq: Four times a day (QID) | INTRAMUSCULAR | Status: DC | PRN
Start: 2014-12-23 — End: 2014-12-24

## 2014-12-23 MED ORDER — METHOCARBAMOL 500 MG PO TABS: 500.0000 mg | ORAL_TABLET | Freq: Four times a day (QID) | ORAL | Status: AC

## 2014-12-23 MED ORDER — CEFAZOLIN SODIUM-DEXTROSE 2-3 GM-% IV SOLR
2.0000 g | Freq: Four times a day (QID) | INTRAVENOUS | Status: AC
  Administered 2014-12-23 (×3): 2 g via INTRAVENOUS
  Filled 2014-12-23 (×3): qty 50

## 2014-12-23 MED ORDER — ACETAMINOPHEN 325 MG PO TABS: 650.0000 mg | ORAL_TABLET | Freq: Four times a day (QID) | ORAL | Status: DC | PRN

## 2014-12-23 MED ORDER — METHOCARBAMOL 500 MG PO TABS
ORAL_TABLET | ORAL | Status: AC
  Filled 2014-12-23: qty 1

## 2014-12-23 MED ORDER — POTASSIUM CHLORIDE IN NACL 20-0.45 MEQ/L-% IV SOLN
INTRAVENOUS | Status: DC
  Administered 2014-12-23: 03:00:00 via INTRAVENOUS
  Filled 2014-12-23: qty 1000

## 2014-12-23 MED ORDER — DEXTROSE 5 % IV SOLN
500.0000 mg | Freq: Four times a day (QID) | INTRAVENOUS | Status: DC | PRN
  Filled 2014-12-23: qty 5

## 2014-12-23 MED ORDER — HYDROMORPHONE HCL 1 MG/ML IJ SOLN
1.0000 mg | INTRAMUSCULAR | Status: DC | PRN
  Administered 2014-12-23 (×2): 1 mg via INTRAVENOUS
  Filled 2014-12-23 (×2): qty 1

## 2014-12-23 MED ORDER — ACETAMINOPHEN 650 MG RE SUPP: 650.0000 mg | Freq: Four times a day (QID) | RECTAL | Status: DC | PRN

## 2014-12-23 MED ORDER — PROMETHAZINE HCL 25 MG/ML IJ SOLN: 6.2500 mg | INTRAMUSCULAR | Status: DC | PRN

## 2014-12-23 MED ORDER — PANTOPRAZOLE SODIUM 40 MG PO TBEC
40.0000 mg | DELAYED_RELEASE_TABLET | Freq: Every day | ORAL | Status: DC
  Administered 2014-12-23 – 2014-12-24 (×2): 40 mg via ORAL
  Filled 2014-12-23 (×2): qty 1

## 2014-12-23 MED ORDER — DOCUSATE SODIUM 100 MG PO CAPS
100.0000 mg | ORAL_CAPSULE | Freq: Two times a day (BID) | ORAL | Status: DC
  Administered 2014-12-23 – 2014-12-24 (×3): 100 mg via ORAL
  Filled 2014-12-23 (×3): qty 1

## 2014-12-23 MED ORDER — METHOCARBAMOL 500 MG PO TABS
500.0000 mg | ORAL_TABLET | Freq: Four times a day (QID) | ORAL | Status: DC | PRN
  Administered 2014-12-23 – 2014-12-24 (×6): 500 mg via ORAL
  Filled 2014-12-23 (×7): qty 1

## 2014-12-23 MED ORDER — METOCLOPRAMIDE HCL 5 MG PO TABS: 5.0000 mg | ORAL_TABLET | Freq: Three times a day (TID) | ORAL | Status: DC | PRN

## 2014-12-23 MED ORDER — HYDROMORPHONE HCL 1 MG/ML IJ SOLN
INTRAMUSCULAR | Status: AC
  Filled 2014-12-23: qty 1

## 2014-12-23 MED ORDER — NEOSTIGMINE METHYLSULFATE 10 MG/10ML IV SOLN
INTRAVENOUS | Status: DC | PRN
  Administered 2014-12-23: 5 mg via INTRAVENOUS

## 2014-12-23 MED ORDER — GLYCOPYRROLATE 0.2 MG/ML IJ SOLN
INTRAMUSCULAR | Status: DC | PRN
  Administered 2014-12-23: 0.6 mg via INTRAVENOUS

## 2014-12-23 MED ORDER — ONDANSETRON HCL 4 MG PO TABS: 4.0000 mg | ORAL_TABLET | Freq: Three times a day (TID) | ORAL | Status: AC | PRN

## 2014-12-23 MED ORDER — DOCUSATE SODIUM 100 MG PO CAPS: 100.0000 mg | ORAL_CAPSULE | Freq: Two times a day (BID) | ORAL | Status: AC

## 2014-12-23 MED ORDER — HYDROMORPHONE HCL 1 MG/ML IJ SOLN
0.2500 mg | INTRAMUSCULAR | Status: DC | PRN
  Administered 2014-12-23 (×2): 0.5 mg via INTRAVENOUS

## 2014-12-23 MED ORDER — OXYCODONE-ACETAMINOPHEN 5-325 MG PO TABS: 1.0000 | ORAL_TABLET | ORAL | Status: AC | PRN

## 2014-12-23 MED ORDER — ENOXAPARIN SODIUM 40 MG/0.4ML ~~LOC~~ SOLN: 40.0000 mg | SUBCUTANEOUS | Status: AC

## 2014-12-23 MED ORDER — ONDANSETRON HCL 4 MG PO TABS: 4.0000 mg | ORAL_TABLET | Freq: Four times a day (QID) | ORAL | Status: DC | PRN

## 2014-12-23 MED ORDER — ONDANSETRON HCL 4 MG/2ML IJ SOLN
INTRAMUSCULAR | Status: DC | PRN
  Administered 2014-12-23: 4 mg via INTRAVENOUS

## 2014-12-23 NOTE — Progress Notes (Signed)
Chaplain was paged to  a level 2 truama. Pt was being attended to by care team. Wife was in the waiting area. Chaplain escorted wife and cousin back to E40. Wife and Pt said there was no other need. Chaplain is available for page for further assistance    12/23/14 0600  Clinical Encounter Type  Visited With Patient and family together  Visit Type Spiritual support  Referral From Nurse  Spiritual Encounters  Spiritual Needs Emotional
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
## 2014-12-23 NOTE — Op Note (Addendum)
12/22/2014 - 12/23/2014  12:42 AM  PATIENT:  Brandon [name]    PRE-OPERATIVE DIAGNOSIS:  right tibia fracture  POST-OPERATIVE DIAGNOSIS:  Same  PROCEDURE:  INTRAMEDULLARY (IM) NAIL TIBIAL, IRRIGATION AND DEBRIDEMENT RIGHT LOWER LEG  SURGEON:  Telesia Ates, Jewel Baize, MD  ASSISTANT: Janalee Dane, PA-C, She was present and scrubbed throughout the case, critical for completion in a timely fashion, and for retraction, instrumentation, and closure.   ANESTHESIA:   General  PREOPERATIVE INDICATIONS:  [name] is a  34 y.o. male with a diagnosis of right tibia fracture who failed conservative measures and elected for surgical management.    The risks benefits and alternatives were discussed with the patient preoperatively including but not limited to the risks of infection, bleeding, nerve injury, cardiopulmonary complications, the need for revision surgery, among others, and the patient was willing to proceed.  OPERATIVE IMPLANTS: [employer]ker T2 Tibial nail 330x9, fibular titanium plate   BLOOD LOSS: minimal  COMPLICATIONS: none  TOURNIQUET TIME: none  OPERATIVE PROCEDURE:  Patient was identified in the preoperative holding area and site was marked by me He was transported to the operating theater and placed on the table in supine position taking care to pad all bony prominences. After a preincinduction time out anesthesia was induced. The right lower extremity was prepped and draped in normal sterile fashion and a pre-incision timeout was performed. Hodari Leppo received ancef for preoperative antibiotics.   The leg was placed over the radiolucent triangle. I then made a 4 cm incision over the patella tendon. I dissected down and incised the patella tendon taking care to not penetrate into the joint itself.  I made a 5 cm incision over his fibula fracture. Identified his superficial peroneal nerve and protected this.  I performed a provisional reduction and placed a bridging plate across  his fibula fracture. I was happy with the reduction on multiple x-rays.  I then extended his medial traumatic wound to 4cm and delivered his bone ends for thurough irrigation and debridement. I performed a debridement of skin/muscle and bone at his open fracture site using a knife and ronjur.    I placed a guidewire under fluoroscopic guidance just medial to lateral tibial spine. I was happy with this placement on all views. I used the entry reamer to create a path into the proximal tibia staying out of the joint itself.  I then passed the ball tip guidewire down the tibia and across the fracture site. I held appropriate reduction confirmed on multiple views of fluoroscopy and sequentially reamed up to an appropriate size with appropriate chatter. I selected the above-sized nail and passed it down the ball-tipped guidewire and seated it completely to a was sunk into the proximal tibia.  I then used the outrigger placed a static transverse and 2 oblique proximal locking screws. As happy with her length and placement on multiple oblique fluoroscopic views.  Next I confirmed appropriate rotation of the tibia with fracture tease and orientation of the patella and toes. I then used a perfect circles technique to place 2 distal static interlock screws.  I performed a complex closure of his traumatic 4cm wound.   The wounds were then all thoroughly irrigated and closed in layers. Sterile dressing was applied and he was taken to the PACU in stable condition.  POST OPERATIVE PLAN: NWB, DVT prophylaxis: Early ambulation, SCD's, ASA 325  This note was generated using a template and dragon dictation system. In light of that, I have reviewed the note  and all aspects of it are applicable to this case. Any dictation errors are due to the computerized dictation system.

## 2014-12-23 NOTE — Discharge Instructions (Signed)
Keep dressings clean and dry till follow up  Elevate to help control swelling  NON-WEIGHT bearing in the R leg and in the boot at all times  Lovenox daily for 30 days for DVT prophylaxis

## 2014-12-23 NOTE — Transfer of Care (Signed)
Immediate Anesthesia Transfer of Care Note  Patient: Brandon [name]  Procedure(s) Performed: Procedure(s): INTRAMEDULLARY (IM) NAIL TIBIAL (Right) IRRIGATION AND DEBRIDEMENT RIGHT LOWER LEG (Right) OPEN REDUCTION INTERNAL FIXATION (ORIF) FIBULA FRACTURE (Right)  Patient Location: PACU  Anesthesia Type:General  Level of Consciousness: awake, alert  and oriented  Airway & Oxygen Therapy: Patient Spontanous Breathing and Patient connected to face mask oxygen  Post-op Assessment: Report given to RN and Post -op Vital signs reviewed and stable  Post vital signs: Reviewed and stable  Last Vitals:  Filed Vitals:   12/23/14 0100  BP: 126/60  Pulse: 97  Temp:   Resp: 28    Complications: No apparent anesthesia complications

## 2014-12-23 NOTE — Anesthesia Postprocedure Evaluation (Signed)
  Anesthesia Post-op Note  Patient: Brandon [name]  Procedure(s) Performed: Procedure(s): INTRAMEDULLARY (IM) NAIL TIBIAL (Right) IRRIGATION AND DEBRIDEMENT RIGHT LOWER LEG (Right) OPEN REDUCTION INTERNAL FIXATION (ORIF) FIBULA FRACTURE (Right)  Patient Location: PACU  Anesthesia Type:General  Level of Consciousness: awake  Airway and Oxygen Therapy: Patient Spontanous Breathing  Post-op Pain: mild  Post-op Assessment: Post-op Vital signs reviewed              Post-op Vital Signs: Reviewed  Last Vitals:  Filed Vitals:   12/23/14 0150  BP: 125/67  Pulse: 82  Temp: 37.2 C  Resp: 24    Complications: No apparent anesthesia complications

## 2014-12-23 NOTE — Progress Notes (Signed)
     Subjective:  POD#1 MI nail of R tibia and ORIF of distal fibula. Patient reports pain as moderate.  Resting comfortably in bed.  Family is at the bedside.    Objective:   VITALS:   Filed Vitals:   12/23/14 0138 12/23/14 0145 12/23/14 0150 12/23/14 0627  BP: 122/62 120/72 125/67 119/83  Pulse: 68 69 82 85  Temp:   98.9 F (37.2 C) 99.3 F (37.4 C)  TempSrc:    Oral  Resp: SpO2: 100% 97% 99% 99%    Neurologically intact ABD soft Neurovascular intact Sensation intact distally Intact pulses distally Incision: dressing C/D/I CAM boot to the R leg  Lab Results  Component Value Date   WBC 9.1 12/22/2014   HGB 12.2* 12/22/2014   HCT 36.3* 12/22/2014   MCV 89.0 12/22/2014   PLT 232 12/22/2014   BMET    Component Value Date/Time   NA 136 12/22/2014 1900   K 3.9 12/22/2014 1900   CL 101 12/22/2014 1900   CO2 27 12/22/2014 1900   GLUCOSE 106* 12/22/2014 1900   BUN 9 12/22/2014 1900   CREATININE 1.23 12/22/2014 1900   CALCIUM 9.0 12/22/2014 1900   GFRNONAA >60 12/22/2014 1900   GFRAA >60 12/22/2014 1900     Assessment/Plan: 1 Day Post-Op   Principal Problem:   Right tibial fracture   Up with therapy NWB in the RLE in the CAM boot at all times Will see how he does with PT today.  If pain is well controlled, may be able to be discharged home today.   Brandon [name] Brandon [name] 12/23/2014, 7:31 AM Cell [phone]

## 2014-12-23 NOTE — Care Management Note (Signed)
Case Management Note  Patient Details  Name: Harel Repetto MRN: [id number] Date of Birth: 02/26/1981  Subjective/Objective:                 Right tibial fracture, s/p IM nail tibial   Action/Plan: Spoke with patient about d/c plan, he selected [facility]. Contacted [facility] at [facility] and set up HHPT, HHOT and HHRN. Patient states that he will have a brother and his wife to assist him after discharge. Will continue to folow for any DME needs.    Expected Discharge Date:                  Expected Discharge Plan:  Home w Home Health Services  In-House Referral:  NA  Discharge planning Services  CM Consult  Post Acute Care Choice:  Home Health Choice offered to:  Patient  DME Arranged:  Crutches DME Agency:     HH Arranged:  RN, PT, OT HH Agency:  [facility]  Status of Service:  Completed, signed off  Medicare Important Message Given:    Date Medicare IM Given:    Medicare IM give by:    Date Additional Medicare IM Given:    Additional Medicare Important Message give by:     If discussed at Long Length of Stay Meetings, dates discussed:    Additional Comments:  Monica Becton, RN 12/23/2014, 2:23 PM
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
## 2014-12-24 NOTE — Progress Notes (Signed)
     Subjective:  Patient reports pain as moderate.  Resting comfortably in bed this morning.  Worked with PT/OT yesterday.  Some difficulty with new WB restrictions.  They recommended another session of PT before discharge.    Objective:   VITALS:   Filed Vitals:   12/23/14 0627 12/23/14 1317 12/23/14 2213 12/24/14 0552  BP: 119/83 115/76 127/66 115/71  Pulse: 85 88 82 86  Temp: 99.3 F (37.4 C) 98.1 F (36.7 C) 98.8 F (37.1 C) 98.2 F (36.8 C)  TempSrc: Oral Oral Oral Oral  Resp: SpO2: 99% 100% 97% 96%    Neurologically intact ABD soft Neurovascular intact Sensation intact distally Intact pulses distally Incision: dressing C/D/I R leg in CAM boot  Lab Results  Component Value Date   WBC 9.1 12/22/2014   HGB 12.2* 12/22/2014   HCT 36.3* 12/22/2014   MCV 89.0 12/22/2014   PLT 232 12/22/2014   BMET    Component Value Date/Time   NA 136 12/22/2014 1900   K 3.9 12/22/2014 1900   CL 101 12/22/2014 1900   CO2 27 12/22/2014 1900   GLUCOSE 106* 12/22/2014 1900   BUN 9 12/22/2014 1900   CREATININE 1.23 12/22/2014 1900   CALCIUM 9.0 12/22/2014 1900   GFRNONAA >60 12/22/2014 1900   GFRAA >60 12/22/2014 1900     Assessment/Plan: 2 Days Post-Op   Principal Problem:   Right tibial fracture Active Problems:   Open tibial fracture   Up with therapy NWB in the RLE in CAM boot at all times Will plan for discharge home today after sessions with PT/OT.   Merci Walthers Hilda Lias 12/24/2014, 8:07 AM Cell [phone]
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
## 2014-12-24 NOTE — Progress Notes (Signed)
Orthopedic Tech Progress Note Patient Details:  Brandon [name] 1980/05/10 [id number]  Ortho Devices Type of Ortho Device: Crutches Ortho Device/Splint Location: RLE Ortho Device/Splint Interventions: Application   [name] 12/24/2014, 10:15 AM

## 2015-12-20 IMAGING — CR DG TIBIA/FIBULA PORT 2V*R*
4 series · 4 of 4 positions shown · non-contrast
Comparison: Right tibia/fibula images performed earlier today at
[DATE] a.m.

CLINICAL DATA: Internal fixation of right tibial and fibular
fractures. Initial encounter.

EXAM:
PORTABLE RIGHT TIBIA AND FIBULA - 2 VIEW

[AP (1 of 2)]
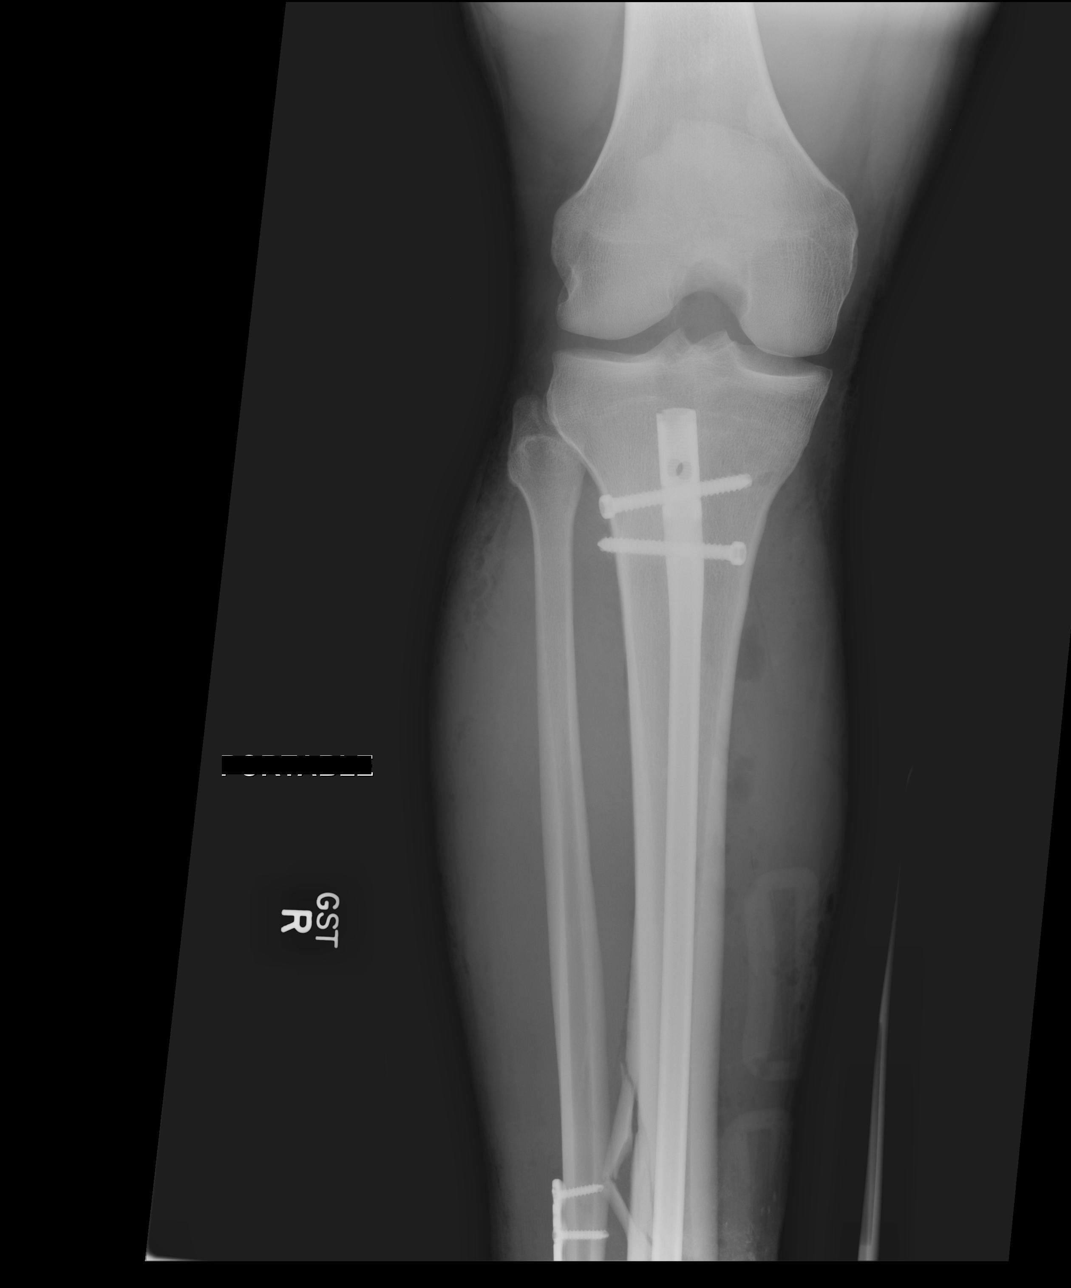

[lateral (1 of 2)]
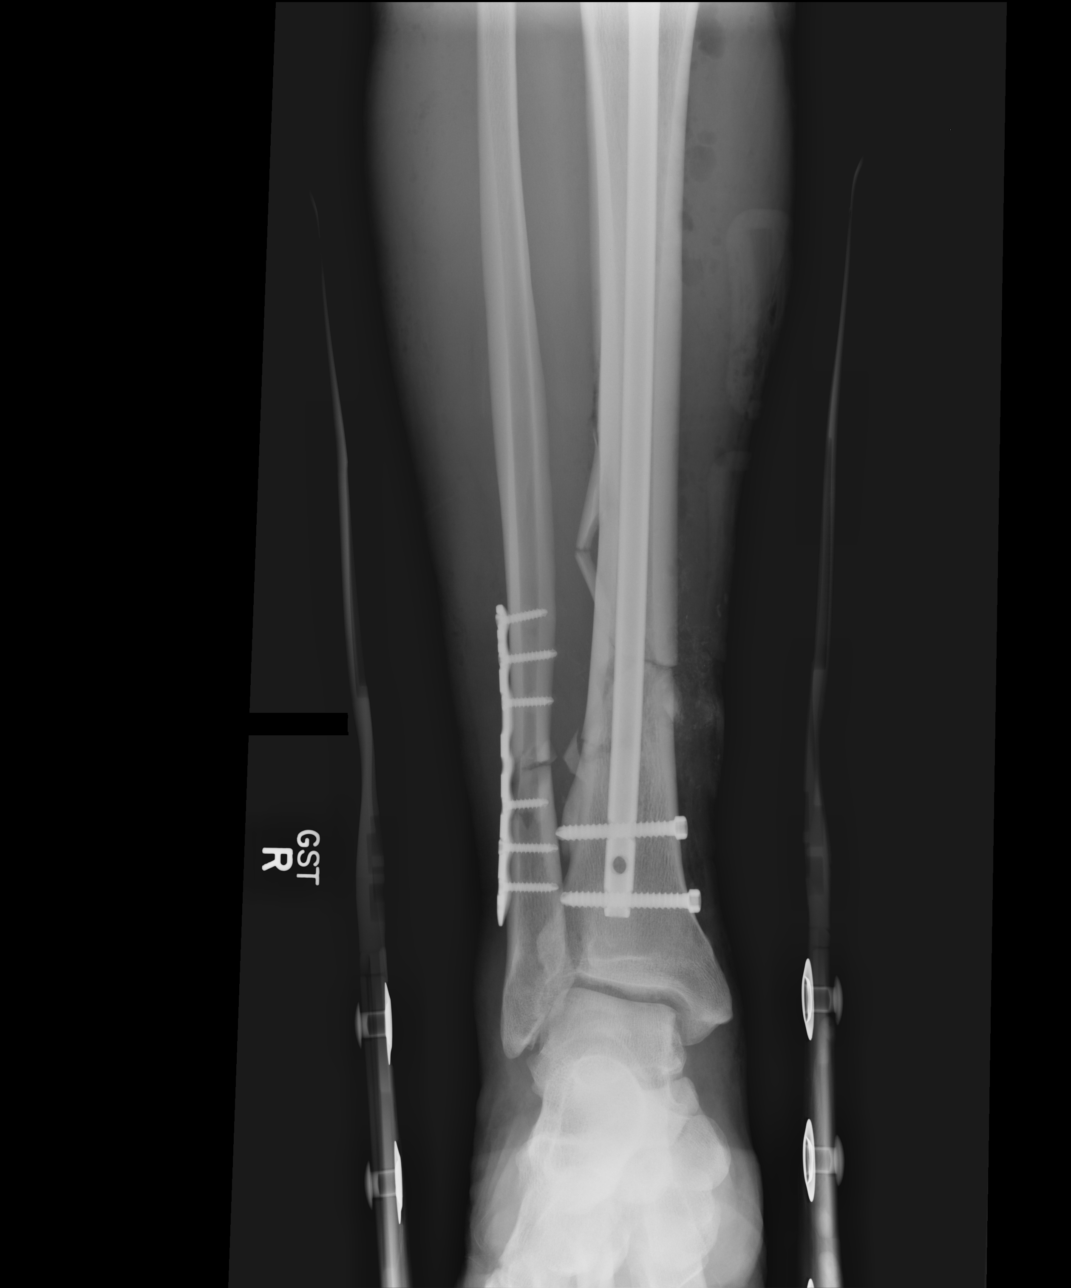

[AP (2 of 2)]
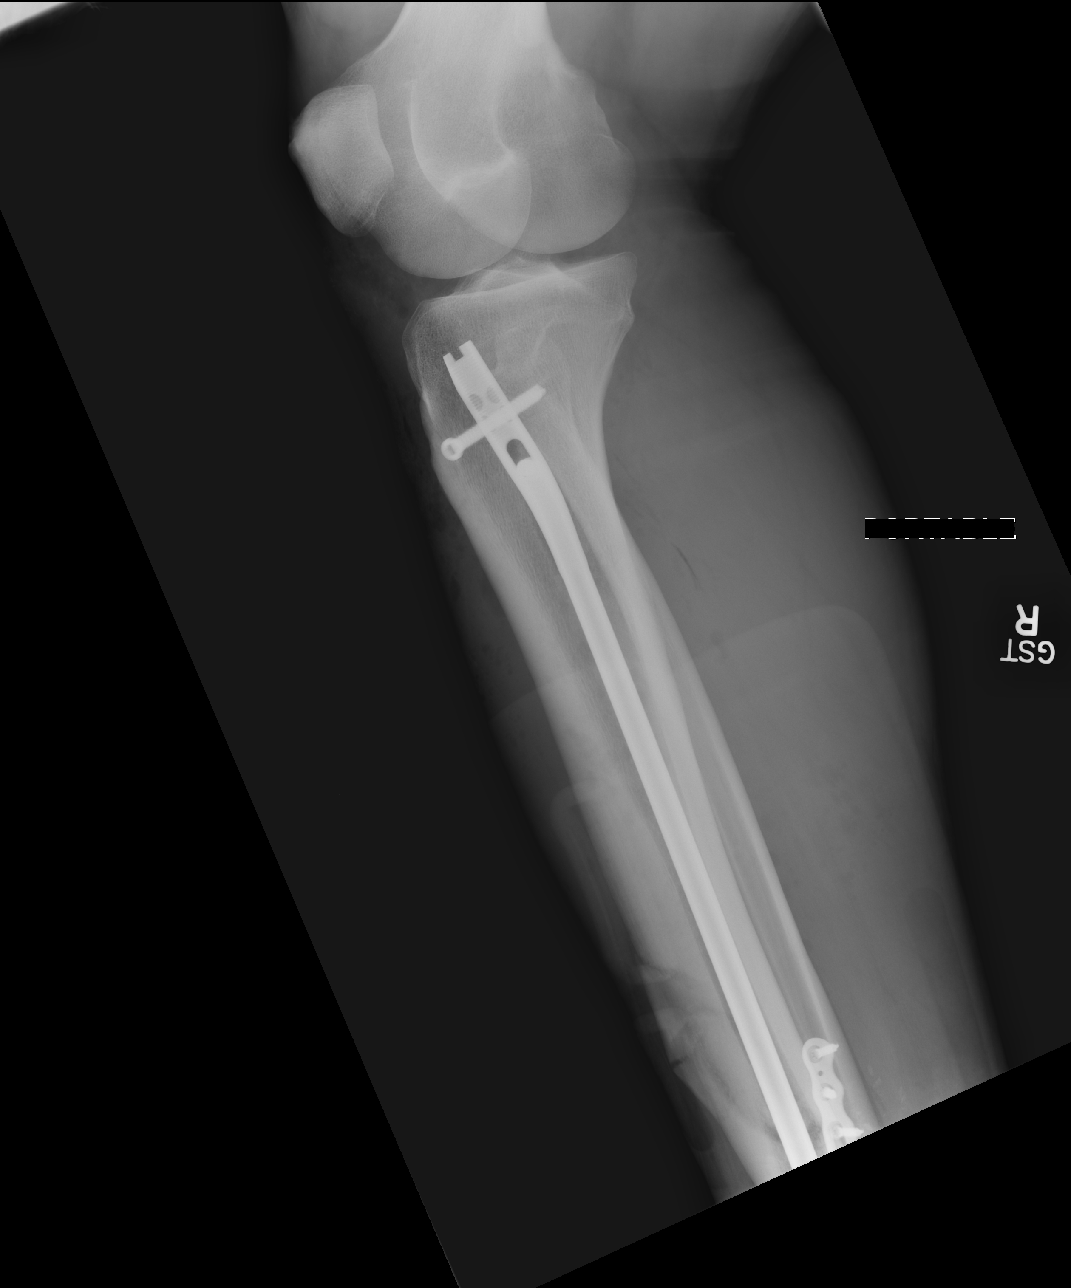

[lateral (2 of 2)]
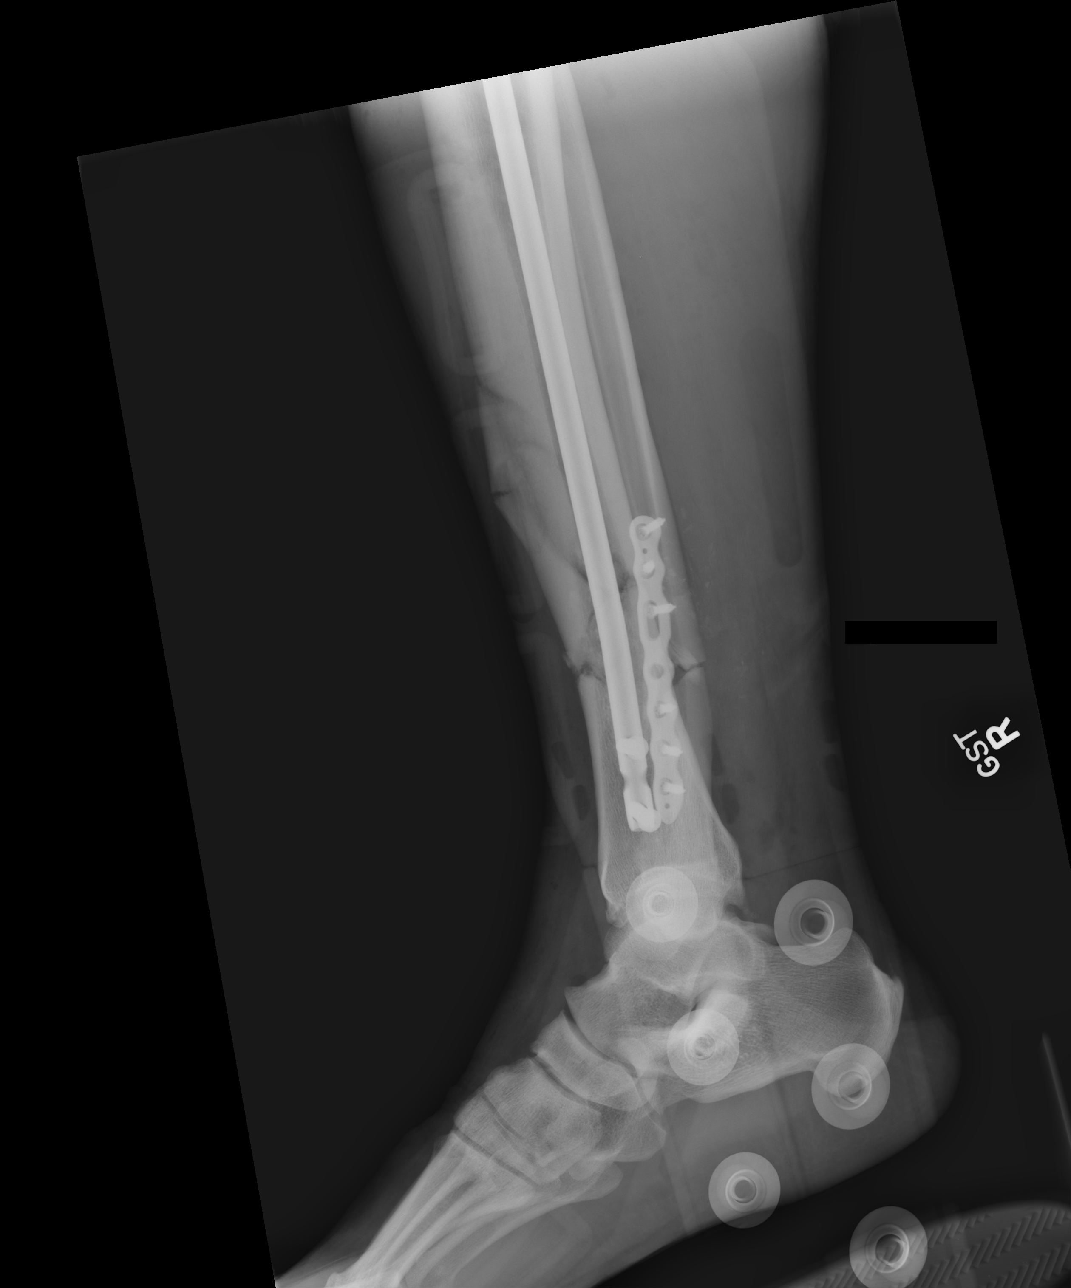

[4 of 4 positions shown; findings below may reference images not displayed]

FINDINGS: The patient is status post internal fixation of tibial and fibular
fractures, noted in grossly anatomic alignment. Mildly displaced
comminuted fragments are still seen about the tibial fracture. No
new fractures are identified. Scattered surrounding soft tissue
swelling and disruption are again noted.

The knee joint is grossly unremarkable in appearance.
IMPRESSION: Status post internal fixation of tibial and fibular fractures in
grossly anatomic alignment. Mildly displaced comminuted fragments
again noted about the tibial fracture.
